# Patient Record
Sex: Female | Born: 1960 | Race: White | Hispanic: No | Marital: Married | Smoking: Never smoker
Health system: Southern US, Community
[De-identification: ages and names within clinical notes are randomized; demographics above are authoritative.]

## PROBLEM LIST (undated history)

## (undated) DIAGNOSIS — E119 Type 2 diabetes mellitus without complications: Secondary | ICD-10-CM

---

## 1992-09-18 HISTORY — PX: CHOLECYSTECTOMY: SHX55

## 1998-05-15 ENCOUNTER — Ambulatory Visit (HOSPITAL_COMMUNITY): Admission: RE | Admit: 1998-05-15 | Discharge: 1998-05-15 | Payer: Self-pay | Admitting: Family Medicine

## 2003-04-24 ENCOUNTER — Encounter: Payer: Self-pay | Admitting: Family Medicine

## 2003-04-24 ENCOUNTER — Ambulatory Visit (HOSPITAL_COMMUNITY): Admission: RE | Admit: 2003-04-24 | Discharge: 2003-04-24 | Payer: Self-pay | Admitting: Family Medicine

## 2004-05-12 ENCOUNTER — Ambulatory Visit (HOSPITAL_BASED_OUTPATIENT_CLINIC_OR_DEPARTMENT_OTHER): Admission: RE | Admit: 2004-05-12 | Discharge: 2004-05-12 | Payer: Self-pay | Admitting: Oral and Maxillofacial Surgery

## 2005-09-18 HISTORY — PX: TOTAL THYROIDECTOMY: SHX2547

## 2005-12-19 ENCOUNTER — Encounter: Admission: RE | Admit: 2005-12-19 | Discharge: 2005-12-19 | Payer: Self-pay | Admitting: General Surgery

## 2006-01-01 ENCOUNTER — Encounter: Admission: RE | Admit: 2006-01-01 | Discharge: 2006-01-01 | Payer: Self-pay | Admitting: General Surgery

## 2006-01-01 ENCOUNTER — Other Ambulatory Visit: Admission: RE | Admit: 2006-01-01 | Discharge: 2006-01-01 | Payer: Self-pay | Admitting: Interventional Radiology

## 2006-01-01 ENCOUNTER — Encounter (INDEPENDENT_AMBULATORY_CARE_PROVIDER_SITE_OTHER): Payer: Self-pay | Admitting: *Deleted

## 2006-02-13 ENCOUNTER — Ambulatory Visit (HOSPITAL_COMMUNITY): Admission: RE | Admit: 2006-02-13 | Discharge: 2006-02-14 | Payer: Self-pay | Admitting: General Surgery

## 2006-02-13 ENCOUNTER — Encounter (INDEPENDENT_AMBULATORY_CARE_PROVIDER_SITE_OTHER): Payer: Self-pay | Admitting: *Deleted

## 2006-03-30 ENCOUNTER — Encounter: Admission: RE | Admit: 2006-03-30 | Discharge: 2006-03-30 | Payer: Self-pay | Admitting: Endocrinology

## 2006-04-06 ENCOUNTER — Encounter: Admission: RE | Admit: 2006-04-06 | Discharge: 2006-04-06 | Payer: Self-pay | Admitting: Endocrinology

## 2006-04-13 ENCOUNTER — Encounter: Admission: RE | Admit: 2006-04-13 | Discharge: 2006-04-13 | Payer: Self-pay | Admitting: Endocrinology

## 2007-05-15 ENCOUNTER — Encounter: Admission: RE | Admit: 2007-05-15 | Discharge: 2007-05-15 | Payer: Self-pay | Admitting: Endocrinology

## 2007-05-29 IMAGING — CR DG CHEST 2V
2 series · 2 of 2 positions shown · non-contrast
Comparison: None

CLINICAL DATA: Multinodular goiter, preop

CHEST - 2 VIEW:

[view not recorded (1 of 2)]
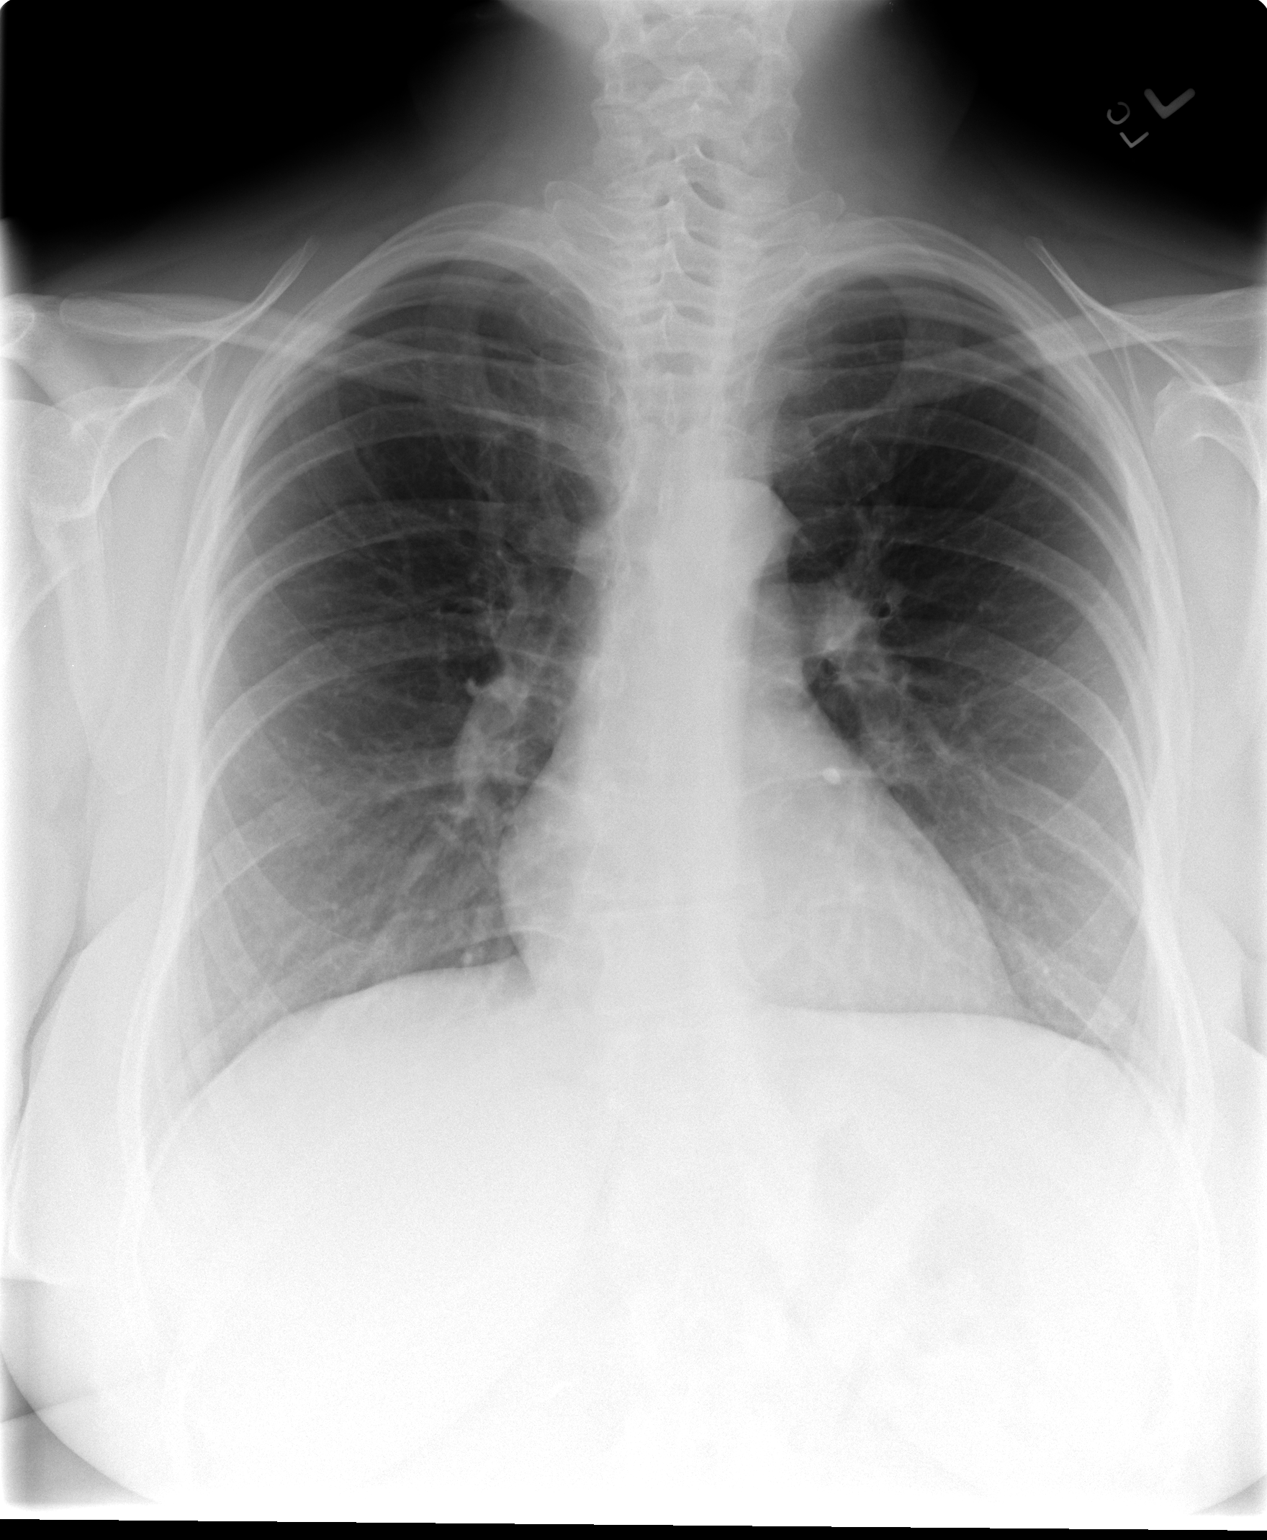

[view not recorded (2 of 2)]
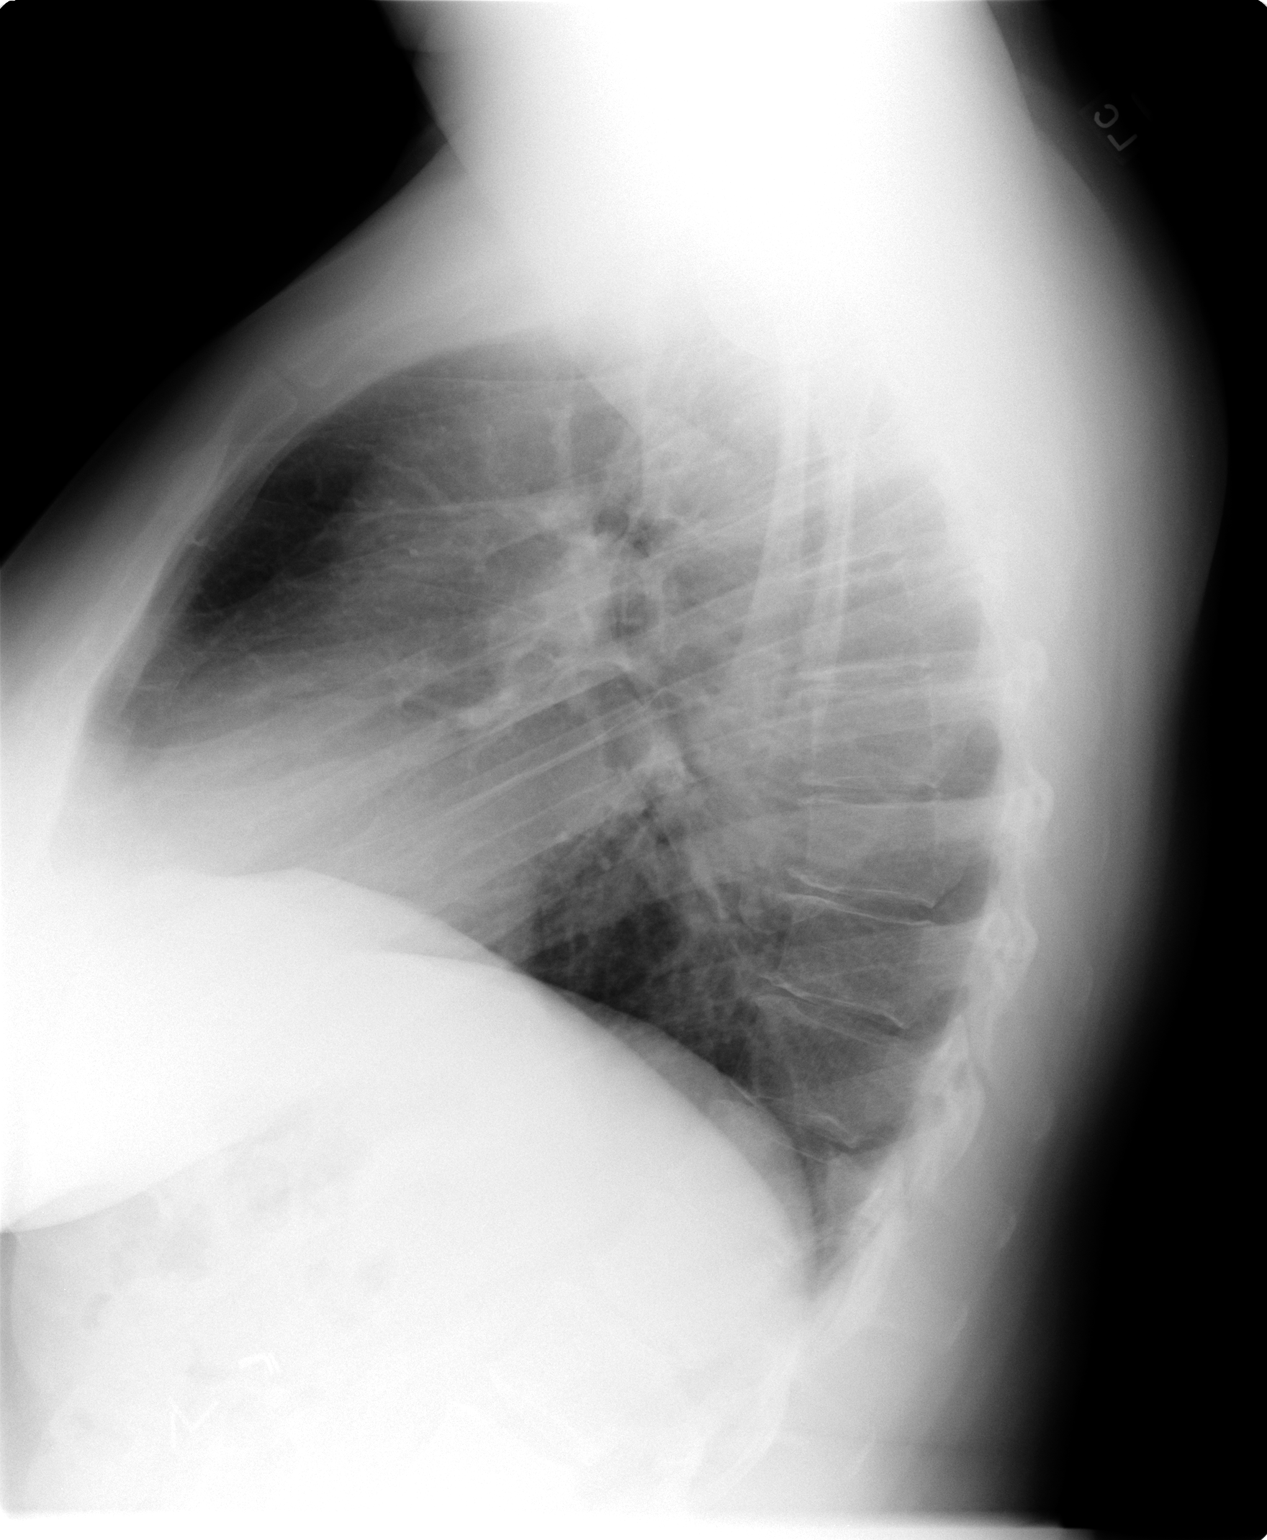

[2 of 2 positions shown; findings below may reference images not displayed]

FINDINGS: The heart size and mediastinal contours are within normal limits. 
Both lungs are clear.  The visualized skeletal structures are unremarkable.
IMPRESSION: No active cardiopulmonary disease

## 2007-07-20 IMAGING — NM NM RAI THERAPY CANCER THYROID
1 series · 1 of 1 positions shown · non-contrast
Comparison: none

CLINICAL DATA: 45-year-old, thyroid cancer. 
 Q-BRB THERAPY FOR THYROID CANCER:
 Written informed consent was obtained from the patient and her husband.   The patient received a total of 105.7 mCi Q-BRB orally.  Appropriate precautions and instructions were reviewed with the patient.  Patient will return for a 10 day posttherapy exam.

[st statics, dual detec · 1 of 1 slices shown]
[im 1/1]
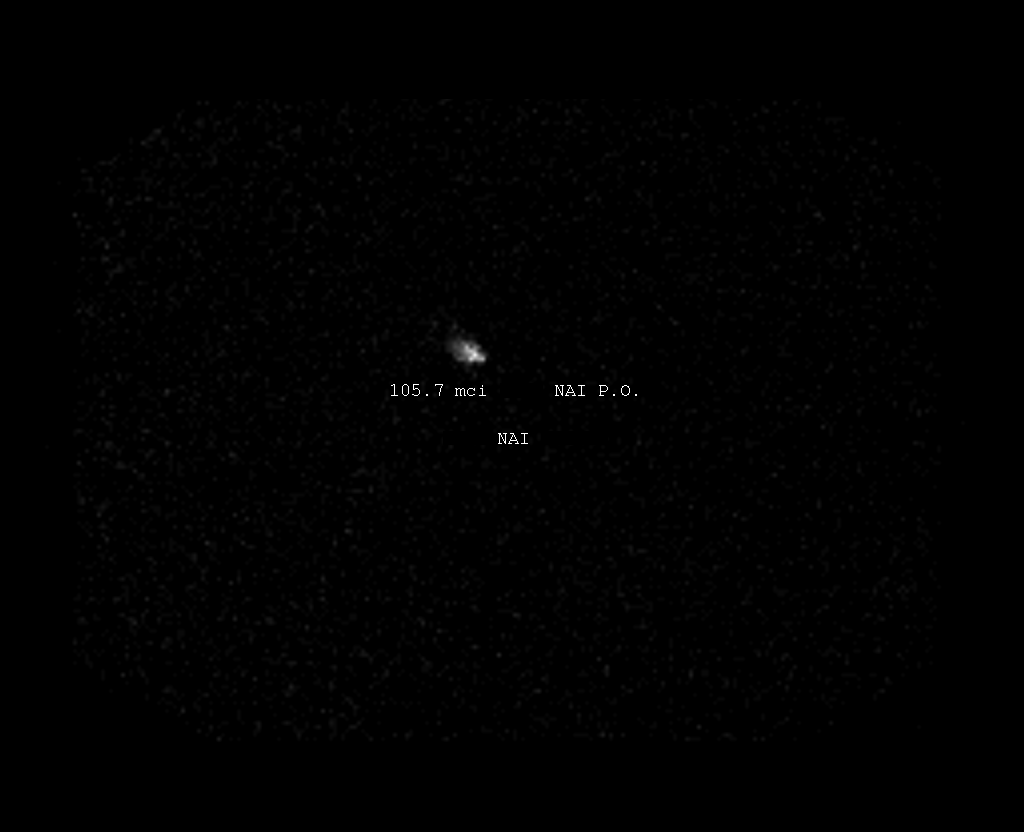

[1 of 1 positions shown; findings below may reference images not displayed]

IMPRESSION: Treatment of thyroid cancer with 105.7 mCi Q-BRB.

## 2008-05-13 ENCOUNTER — Encounter: Admission: RE | Admit: 2008-05-13 | Discharge: 2008-05-13 | Payer: Self-pay | Admitting: Internal Medicine

## 2008-06-08 ENCOUNTER — Encounter (HOSPITAL_COMMUNITY): Admission: RE | Admit: 2008-06-08 | Discharge: 2008-09-06 | Payer: Self-pay | Admitting: Internal Medicine

## 2009-05-12 ENCOUNTER — Encounter: Admission: RE | Admit: 2009-05-12 | Discharge: 2009-05-12 | Payer: Self-pay | Admitting: Internal Medicine

## 2010-06-20 ENCOUNTER — Encounter: Admission: RE | Admit: 2010-06-20 | Discharge: 2010-06-20 | Payer: Self-pay | Admitting: Internal Medicine

## 2010-10-09 ENCOUNTER — Encounter: Payer: Self-pay | Admitting: Endocrinology

## 2011-02-03 NOTE — Op Note (Signed)
NAME:  Alice, Meyer NO.:  [id number]   MEDICAL RECORD NO.:  [id number]          PATIENT TYPE:  OIB   LOCATION:  1614                         FACILITY:  [facility]   PHYSICIAN:  Anselm Pancoast. 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   OPERATIONS:  Total thyroidectomy general anesthesia.   SURGEON:  Anselm Pancoast. Zachery Dakins, M.D.   ASSISTANT:  Adolph Pollack, M.D.   HISTORY:  Alice Meyer is a 50 year old female whom I first saw approximately  3 years ago when she was referred for a goiter and tenderness and hoarseness  in her neck.  She had an ultrasound that obviously showed a multinodular  goiter.  She was definitely hoarse, and was having minimal problems with  swallowing; and I referred her to [facility] and they did a very careful  indirect laryngoscopy in the center and noted that this was probably related  to aspiration; and treated her for the laryngitis.  I also placed her on  thyroid suppression and she became much less symptomatic.  The gland  decreased in size, originally she was on 175 mcg, she weighs about 235 and  we decreased it to 125 after probably about 6 months.  With this, she  appeared to be doing fine, no hoarseness or tenderness within her neck; and  I saw her back and it appeared that the gland was beginning to increase in  size slightly, and her TSS was not suppressed.  The TSH was 483 where it had  been down to the .027 range and we repeated her ultrasound, also in this  interim.  Her mother had had a thyroid cancer and we did a biopsy, increased  her Synthroid to 150 grams with the gland decreasing in size, again, and the  aspiration cytology did show some occasional Hurthle cell, and malignancy  could not be definitely ruled out.  She and I decided to go ahead  and plan  on doing a total thyroidectomy; and she is electively scheduled for this.  She has not had any hoarseness recently, does occasionally get a little bit  of pressure-type symptoms with swallowing.   MEDICATIONS:  She is on medications of Valtrex and her thyroid replacement.  She is also on a multivitamin and baby aspirin which she has held for the  last week.   DESCRIPTION OF PROCEDURE:  The patient was taken to the operative suite,  positioned on the OR table.  Induction of general anesthesia and  endotracheal tube placed.  A roll was placed up under her scapula and she  was draped sterilely.  Prepped with Betadine and surgical solution, and  placed the bed in a very slight reverse Trendelenburg position.  I marked  the area for the thyroid incision about two fingerbreadths at mid and about  a finger laterally, and then about a 5-cm incision was made.  Sharp  dissection through the skin, subcutaneous  tissue and platysma.   We elevated the platysma superiorly and inferiorly and then  __________  retractor was placed.  I then opened the strap muscles, and you could see  this finger which is the area that was biopsied was kind of coming off the  isthmus and to the left; and then the other left and right lobes had the  strap muscles adherent to this; and it definitely teased the structures.  The inferior lobe on the right was first elevated and then the vessels were  either tied with 3-0 silk, baby hemoclips between the gland, and elevated  this and this allowed [place] to get a little bit of mobility.  Then we switched  to the superior right; and I could encompass the superior vessels coming to  the superior lobe, and then I doubly tied with 2-0 silk and put a regular  clip, and then divided them.   Then I could rotate the gland into the field.  I still had not been able to  identify the recurrent laryngeal nerve and we, kind of very carefully teased  the gland medially.  There was a  little finger of thyroid tissue going up to  right where, I am sure, the recurrent laryngeal nerve was; and I elected to  switch to the left side, at this point, to get a little bit mobility.  The  left side was about the same size.  This finger that they had actually  biopsied was coming really off the isthmus to the left; and then the  inferior vessels were similarly manage; and divided them really right on the  gland; and then the little superior lobe encompassed the vessels; and these  were tied.  There was one tied with 3-0 and one tied with 2-0 silk and  clipped.   Next the gland could be rotated into the field.  A parathyroid gland,  superior, was identified and likewise on the left side that was this little  finger of thyroid tissue.  We identified the recurrent laryngeal nerve where  it kind of goes into the lateral horn of the trachea; and elected, since  this appears to be really very minimally any attached to the gland itself,  left it in place as we freed up the left gland; and did the similar on the  right, but the little finger of thyroid tissue on the right could be teased.  It was immediately adherent up into the superior parathyroid.  The isthmus  was separated from the trachea and larynx; and then the specimen was sent  for examination.  Dr. Carney Living examined the tissue; and he said this was  definitely Hashimoto's thyroiditis; there was one little area that he says  he is not sure, but I elected to leave this little, probably 0.5 gram of  thyroid tissue, on the left making sure that he did not damage the recurrent  laryngeal nerve because of its proximity.   The bleeding had been controlled; a couple of little hemoclips were placed  and 2 little pieces of Surgicel.  The strap muscles were approximated with 4-  0 Vicryl.  Platysma was approximated with 4-0 Vicryl; and then a 4-0 Monocryl and Benzoin and Steri-Strips on the skin.  The patient had  tolerated the procedure  nicely; and on extubation we could see that both  vocal cords moved symmetrically; and she was breathing without difficulty.           ______________________________  Anselm Pancoast.  Zachery Dakins, M.D.     WJW/MEDQ  D:  02/13/2006  T:  02/13/2006  Job:  [id number]

## 2011-02-03 NOTE — Procedures (Signed)
NAME:  Alice Meyer, Alice Meyer               ACCOUNT NO.:  [id number]   MEDICAL RECORD NO.:  [id number]          PATIENT TYPE:  OUT   LOCATION:  SLEEP CENTER                 FACILITY:  [facility]   PHYSICIAN:  Clinton D. Maple Hudson, M.D. DATE OF BIRTH:  01/10/1961   DATE OF ADMISSION:  05/12/2004  DATE OF DISCHARGE:                              NOCTURNAL POLYSOMNOGRAM   REFERRING PHYSICIAN:  Dr. Dutch Quint   INDICATION FOR STUDY:  Hypersomnia with sleep apnea, loud snoring, sleep  paralysis, restless and nonrestorative sleep with vivid dreams.  Medical  treatment for hypothyroidism.   EPWORTH SLEEPINESS SCORE:  3/24   BODY MASS INDEX:  34.7   WEIGHT:  235 pounds   MEDICATION LISTED:  Synthroid.   SLEEP ARCHITECTURE:  Total sleep time 408 minutes with sleep efficiency 92%.  Stage I was 3%, Stage II 82%, Stages III and IV 6%, REM was 10% of total  sleep time.  Latency to sleep onset 5.5 minutes, latency to REM 189 minutes,  awake after sleep onset 32 minutes, arousal index 44.8/hr.  No sleep  medication was reported.   RESPIRATORY DATA:  NPSG protocol.  Mild obstructive sleep apnea/hypopnea  syndrome, RDI 12.6/hr including 45 hypopneas and 26 obstructive apneas, 10  mixed apneas and 5 central apneas.  Events were not positional.  REM RDI was  60/hr.   OXYGEN DATA:  Light to moderate snoring, especially while sleeping supine,  with mild oxygen desaturation to a nadir of 87%.   CARDIAC DATA:  Normal cardiac rhythm with occasional PVC.   MOVEMENT/PARASOMNIA:  Occasional leg jerks without arousal.   IMPRESSION/RECOMMENDATION:  Mild obstructive sleep apnea/hypopnea syndrome,  respiratory disturbance index 12.6/hr, more pronounced in rapid eye movement  (respiratory disturbance index 60/hr).  This was associated with minimal  oxygen desaturation.  Normal cardiac rhythm with premature ventricular  contractions.  Occasional leg jerk.  Review of video indicates some  nonspecific shifting of arms and  legs mostly during arousals with no unusual  behavior otherwise.  She would qualify to return for continuous positive  airway pressure titration if appropriate, otherwise consider alternative  therapies.                                   ______________________________                                Rennis Chris Maple Hudson, M.D.                                Diplomate, American Board of Sleep Medicine    CDY/MEDQ  D:  05/15/2004 12:40:56  T:  05/16/2004 81:19:14  Job:  [id number]

## 2011-06-19 LAB — TSH: TSH: 20.715 — ABNORMAL HIGH

## 2011-06-19 LAB — THYROGLOBULIN LEVEL: Thyroglobulin: <0.1 ng/mL — ABNORMAL LOW (ref 1.3–31.8)

## 2012-07-01 ENCOUNTER — Other Ambulatory Visit: Payer: Self-pay | Admitting: Internal Medicine

## 2012-07-01 DIAGNOSIS — C73 Malignant neoplasm of thyroid gland: Secondary | ICD-10-CM

## 2012-07-09 ENCOUNTER — Ambulatory Visit
Admission: RE | Admit: 2012-07-09 | Discharge: 2012-07-09 | Disposition: A | Discharge status: Home / Self Care | Payer: 59 | Source: Ambulatory Visit | Attending: Internal Medicine | Admitting: Internal Medicine

## 2012-07-09 DIAGNOSIS — C73 Malignant neoplasm of thyroid gland: Secondary | ICD-10-CM

## 2012-07-10 ENCOUNTER — Other Ambulatory Visit: Payer: Self-pay

## 2012-10-15 ENCOUNTER — Other Ambulatory Visit (HOSPITAL_COMMUNITY)
Admission: RE | Admit: 2012-10-15 | Discharge: 2012-10-15 | Disposition: A | Discharge status: Home / Self Care | Payer: 59 | Source: Ambulatory Visit | Attending: Obstetrics and Gynecology | Admitting: Obstetrics and Gynecology

## 2012-10-15 ENCOUNTER — Other Ambulatory Visit: Payer: Self-pay | Admitting: Obstetrics and Gynecology

## 2012-10-15 DIAGNOSIS — Z1151 Encounter for screening for human papillomavirus (HPV): Secondary | ICD-10-CM | POA: Insufficient documentation

## 2012-10-15 DIAGNOSIS — Z01419 Encounter for gynecological examination (general) (routine) without abnormal findings: Secondary | ICD-10-CM | POA: Insufficient documentation

## 2012-10-16 ENCOUNTER — Other Ambulatory Visit: Payer: Self-pay | Admitting: Obstetrics and Gynecology

## 2012-10-16 DIAGNOSIS — Z1231 Encounter for screening mammogram for malignant neoplasm of breast: Secondary | ICD-10-CM

## 2012-11-14 ENCOUNTER — Ambulatory Visit
Admission: RE | Admit: 2012-11-14 | Discharge: 2012-11-14 | Disposition: A | Discharge status: Home / Self Care | Payer: 59 | Source: Ambulatory Visit | Attending: Obstetrics and Gynecology | Admitting: Obstetrics and Gynecology

## 2012-11-14 DIAGNOSIS — Z1231 Encounter for screening mammogram for malignant neoplasm of breast: Secondary | ICD-10-CM

## 2013-10-17 ENCOUNTER — Other Ambulatory Visit (HOSPITAL_COMMUNITY)
Admission: RE | Admit: 2013-10-17 | Discharge: 2013-10-17 | Disposition: A | Discharge status: Home / Self Care | Payer: 59 | Source: Ambulatory Visit | Attending: Obstetrics and Gynecology | Admitting: Obstetrics and Gynecology

## 2013-10-17 ENCOUNTER — Other Ambulatory Visit: Payer: Self-pay | Admitting: Obstetrics and Gynecology

## 2013-10-17 DIAGNOSIS — Z1151 Encounter for screening for human papillomavirus (HPV): Secondary | ICD-10-CM | POA: Insufficient documentation

## 2013-10-17 DIAGNOSIS — Z01419 Encounter for gynecological examination (general) (routine) without abnormal findings: Secondary | ICD-10-CM | POA: Insufficient documentation

## 2014-05-20 ENCOUNTER — Other Ambulatory Visit: Payer: Self-pay | Admitting: Gastroenterology

## 2014-08-17 ENCOUNTER — Encounter (HOSPITAL_COMMUNITY): Payer: Self-pay | Admitting: *Deleted

## 2014-09-01 ENCOUNTER — Encounter (HOSPITAL_COMMUNITY): Payer: Self-pay | Admitting: *Deleted

## 2014-09-14 ENCOUNTER — Other Ambulatory Visit: Payer: Self-pay | Admitting: Gastroenterology

## 2014-09-20 ENCOUNTER — Encounter (HOSPITAL_COMMUNITY): Payer: Self-pay | Admitting: Anesthesiology

## 2014-09-20 NOTE — Anesthesia Preprocedure Evaluation (Signed)
Anesthesia Evaluation  Patient identified by MRN, date of birth, ID band Patient awake    Reviewed: Allergy & Precautions, H&P , NPO status , Patient's Chart, lab work & pertinent test results  Airway Mallampati: II  TM Distance: >3 FB Neck ROM: Full    Dental no notable dental hx.    Pulmonary neg pulmonary ROS,  breath sounds clear to auscultation  Pulmonary exam normal       Cardiovascular Exercise Tolerance: Good negative cardio ROS  Rhythm:Regular Rate:Normal     Neuro/Psych negative neurological ROS  negative psych ROS   GI/Hepatic negative GI ROS, Neg liver ROS,   Endo/Other  diabetes, Type 2, Oral Hypoglycemic AgentsHypothyroidism   Renal/GU negative Renal ROS  negative genitourinary   Musculoskeletal negative musculoskeletal ROS (+)   Abdominal   Peds negative pediatric ROS (+)  Hematology negative hematology ROS (+)   Anesthesia Other Findings   Reproductive/Obstetrics negative OB ROS                             Anesthesia Physical Anesthesia Plan  ASA: II  Anesthesia Plan: MAC   Post-op Pain Management:    Induction: Intravenous  Airway Management Planned:   Additional Equipment:   Intra-op Plan:   Post-operative Plan:   Informed Consent: I have reviewed the patients History and Physical, chart, labs and discussed the procedure including the risks, benefits and alternatives for the proposed anesthesia with the patient or authorized representative who has indicated his/her understanding and acceptance.   Dental advisory given  Plan Discussed with: CRNA  Anesthesia Plan Comments:         Anesthesia Quick Evaluation

## 2014-09-21 ENCOUNTER — Encounter (HOSPITAL_COMMUNITY): Payer: Self-pay | Admitting: Certified Registered Nurse Anesthetist

## 2014-09-21 ENCOUNTER — Ambulatory Visit (HOSPITAL_COMMUNITY)
Admission: RE | Admit: 2014-09-21 | Discharge: 2014-09-21 | Disposition: A | Discharge status: Home / Self Care | Payer: 59 | Source: Ambulatory Visit | Attending: Gastroenterology | Admitting: Gastroenterology

## 2014-09-21 ENCOUNTER — Encounter (HOSPITAL_COMMUNITY)
Admission: RE | Disposition: A | Discharge status: Home / Self Care | Payer: Self-pay | Source: Ambulatory Visit | Attending: Gastroenterology

## 2014-09-21 ENCOUNTER — Ambulatory Visit (HOSPITAL_COMMUNITY): Payer: 59 | Admitting: Anesthesiology

## 2014-09-21 DIAGNOSIS — Z85850 Personal history of malignant neoplasm of thyroid: Secondary | ICD-10-CM | POA: Insufficient documentation

## 2014-09-21 DIAGNOSIS — D125 Benign neoplasm of sigmoid colon: Secondary | ICD-10-CM | POA: Insufficient documentation

## 2014-09-21 DIAGNOSIS — E119 Type 2 diabetes mellitus without complications: Secondary | ICD-10-CM | POA: Diagnosis not present

## 2014-09-21 DIAGNOSIS — Z1211 Encounter for screening for malignant neoplasm of colon: Secondary | ICD-10-CM | POA: Diagnosis not present

## 2014-09-21 DIAGNOSIS — Z9889 Other specified postprocedural states: Secondary | ICD-10-CM | POA: Diagnosis not present

## 2014-09-21 DIAGNOSIS — E039 Hypothyroidism, unspecified: Secondary | ICD-10-CM | POA: Insufficient documentation

## 2014-09-21 DIAGNOSIS — K5730 Diverticulosis of large intestine without perforation or abscess without bleeding: Secondary | ICD-10-CM | POA: Diagnosis not present

## 2014-09-21 HISTORY — DX: Type 2 diabetes mellitus without complications: E11.9

## 2014-09-21 HISTORY — PX: COLONOSCOPY WITH PROPOFOL: SHX5780

## 2014-09-21 SURGERY — COLONOSCOPY WITH PROPOFOL
Anesthesia: Monitor Anesthesia Care

## 2014-09-21 MED ORDER — PROPOFOL 10 MG/ML IV BOLUS
INTRAVENOUS | Status: DC | PRN
  Administered 2014-09-21: 80 mg via INTRAVENOUS

## 2014-09-21 MED ORDER — PROPOFOL INFUSION 10 MG/ML OPTIME
INTRAVENOUS | Status: DC | PRN
  Administered 2014-09-21: 140 ug/kg/min via INTRAVENOUS

## 2014-09-21 MED ORDER — SODIUM CHLORIDE 0.9 % IV SOLN: INTRAVENOUS | Status: DC

## 2014-09-21 MED ORDER — LACTATED RINGERS IV SOLN
INTRAVENOUS | Status: DC
  Administered 2014-09-21: 09:00:00 via INTRAVENOUS
  Administered 2014-09-21: 1000 mL via INTRAVENOUS

## 2014-09-21 SURGICAL SUPPLY — 21 items

## 2014-09-21 NOTE — Op Note (Signed)
Procedure: Baseline screening colonoscopy  Endoscopist: Earle Gell  Premedication: Propofol administered by anesthesia  Procedure: The patient was placed in the left lateral decubitus position. Anal inspection and digital rectal exam were normal. The Pentax pediatric colonoscope was introduced into the rectum and advanced to the cecum. A normal-appearing ileocecal valve was identified. A normal-appearing appendiceal orifice was identified. Colonic preparation for the exam today was good. Withdrawal time was 16 minutes  Rectum. Normal. Retroflexed view of the distal rectum normal  Sigmoid colon. Colonic diverticulosis. From the mid sigmoid colon a 3 mm sessile polyp was removed with the cold biopsy forceps  Descending colon. Normal  Splenic flexure. Normal  Transverse colon. Normal  Hepatic flexure. Normal  Ascending colon. Normal  Cecum and ileocecal valve. Normal  Assessment:  #1. From the mid sigmoid colon, a 3 mm sessile polyp was removed with cold biopsy forceps  #2. Sigmoid colon diverticulosis  #3. Otherwise normal colonoscopy  Recommendation: If the sigmoid colon polyp returns adenomatous pathologically, the patient should undergo a surveillance colonoscopy in 5 years. If the polyp returns nonneoplastic, she should undergo a repeat screening colonoscopy in 10 years

## 2014-09-21 NOTE — H&P (Signed)
  Procedure: Baseline screening colonoscopy  History: The patient is a 54 year old female born 05/07/1961. She denies a family history of colon cancer. She is scheduled to undergo her first screening colonoscopy with polypectomy to prevent colon cancer.  Past medical history: Papillary thyroid cancer. Total thyroidectomy. Post thyroidectomy hypothyroidism. Cholecystectomy.  Exam: The patient is alert and lying comfortably on the endoscopy stretcher. Abdomen is soft and nontender to palpation. Lungs are clear to auscultation. Cardiac exam reveals a regular rhythm.  Plan: Proceed with baseline screening colonoscopy

## 2014-09-21 NOTE — Discharge Instructions (Signed)
Colonoscopy, Care After °These instructions give you information on caring for yourself after your procedure. Your doctor may also give you more specific instructions. Call your doctor if you have any problems or questions after your procedure. °HOME CARE °· Do not drive for 24 hours. °· Do not sign important papers or use machinery for 24 hours. °· You may shower. °· You may go back to your usual activities, but go slower for the first 24 hours. °· Take rest breaks often during the first 24 hours. °· Walk around or use warm packs on your belly (abdomen) if you have belly cramping or gas. °· Drink enough fluids to keep your pee (urine) clear or pale yellow. °· Resume your normal diet. Avoid heavy or fried foods. °· Avoid drinking alcohol for 24 hours or as told by your doctor. °· Only take medicines as told by your doctor. °If a tissue sample (biopsy) was taken during the procedure:  °· Do not take aspirin or blood thinners for 7 days, or as told by your doctor. °· Do not drink alcohol for 7 days, or as told by your doctor. °· Eat soft foods for the first 24 hours. °GET HELP IF: °You still have a small amount of blood in your poop (stool) 2-3 days after the procedure. °GET HELP RIGHT AWAY IF: °· You have more than a small amount of blood in your poop. °· You see clumps of tissue (blood clots) in your poop. °· Your belly is puffy (swollen). °· You feel sick to your stomach (nauseous) or throw up (vomit). °· You have a fever. °· You have belly pain that gets worse and medicine does not help. °MAKE SURE YOU: °· Understand these instructions. °· Will watch your condition. °· Will get help right away if you are not doing well or get worse. °Document Released: 10/07/2010 Document Revised: 09/09/2013 Document Reviewed: 05/12/2013 °Exit[employer]® Patient Information ©2015 [employer]. This information is not intended to replace advice given to you by your health care provider. Make sure you discuss any questions you have with  your health care provider. ° °

## 2014-09-21 NOTE — Transfer of Care (Signed)
Immediate Anesthesia Transfer of Care Note  Patient: Alice Meyer  Procedure(s) Performed: Procedure(s): COLONOSCOPY WITH PROPOFOL (N/A)  Patient Location: PACU and Endoscopy Unit  Anesthesia Type:MAC  Level of Consciousness: awake, alert  and oriented  Airway & Oxygen Therapy: Patient Spontanous Breathing and Patient connected to face mask oxygen  Post-op Assessment: Report given to PACU RN and Post -op Vital signs reviewed and stable  Post vital signs: Reviewed and stable  Complications: No apparent anesthesia complications

## 2014-09-21 NOTE — Anesthesia Postprocedure Evaluation (Signed)
  Anesthesia Post-op Note  Patient: Alice Meyer  Procedure(s) Performed: Procedure(s) (LRB): COLONOSCOPY WITH PROPOFOL (N/A)  Patient Location: PACU  Anesthesia Type: MAC  Level of Consciousness: awake and alert   Airway and Oxygen Therapy: Patient Spontanous Breathing  Post-op Pain: mild  Post-op Assessment: Post-op Vital signs reviewed, Patient's Cardiovascular Status Stable, Respiratory Function Stable, Patent Airway and No signs of Nausea or vomiting  Last Vitals:  Filed Vitals:   09/21/14 1010  BP: 120/70  Pulse:   Temp:   Resp:     Post-op Vital Signs: stable   Complications: No apparent anesthesia complications

## 2014-09-22 ENCOUNTER — Encounter (HOSPITAL_COMMUNITY): Payer: Self-pay | Admitting: Gastroenterology

## 2014-09-22 LAB — GLUCOSE, CAPILLARY: GLUCOSE-CAPILLARY: 99 mg/dL (ref 70–99)

## 2014-10-21 ENCOUNTER — Other Ambulatory Visit: Payer: Self-pay | Admitting: Obstetrics and Gynecology

## 2014-10-23 LAB — CYTOLOGY - PAP

## 2015-10-04 ENCOUNTER — Other Ambulatory Visit: Payer: Self-pay

## 2015-10-04 DIAGNOSIS — Z1231 Encounter for screening mammogram for malignant neoplasm of breast: Secondary | ICD-10-CM

## 2015-10-19 ENCOUNTER — Ambulatory Visit
Admission: RE | Admit: 2015-10-19 | Discharge: 2015-10-19 | Disposition: A | Discharge status: Home / Self Care | Payer: BLUE CROSS/BLUE SHIELD | Source: Ambulatory Visit

## 2015-10-19 DIAGNOSIS — Z1231 Encounter for screening mammogram for malignant neoplasm of breast: Secondary | ICD-10-CM

## 2015-10-26 ENCOUNTER — Other Ambulatory Visit: Payer: Self-pay | Admitting: Obstetrics and Gynecology

## 2015-10-26 ENCOUNTER — Other Ambulatory Visit (HOSPITAL_COMMUNITY)
Admission: RE | Admit: 2015-10-26 | Discharge: 2015-10-26 | Disposition: A | Discharge status: Home / Self Care | Payer: BLUE CROSS/BLUE SHIELD | Source: Ambulatory Visit | Attending: Obstetrics and Gynecology | Admitting: Obstetrics and Gynecology

## 2015-10-26 DIAGNOSIS — Z1151 Encounter for screening for human papillomavirus (HPV): Secondary | ICD-10-CM | POA: Diagnosis present

## 2015-10-26 DIAGNOSIS — Z01419 Encounter for gynecological examination (general) (routine) without abnormal findings: Secondary | ICD-10-CM | POA: Diagnosis present

## 2015-10-28 LAB — CYTOLOGY - PAP

## 2016-01-04 DIAGNOSIS — Z85850 Personal history of malignant neoplasm of thyroid: Secondary | ICD-10-CM | POA: Diagnosis not present

## 2016-01-04 DIAGNOSIS — E119 Type 2 diabetes mellitus without complications: Secondary | ICD-10-CM | POA: Diagnosis not present

## 2016-01-04 DIAGNOSIS — E890 Postprocedural hypothyroidism: Secondary | ICD-10-CM | POA: Diagnosis not present

## 2016-04-11 DIAGNOSIS — E119 Type 2 diabetes mellitus without complications: Secondary | ICD-10-CM | POA: Diagnosis not present

## 2016-04-11 DIAGNOSIS — Z1159 Encounter for screening for other viral diseases: Secondary | ICD-10-CM | POA: Diagnosis not present

## 2016-04-11 DIAGNOSIS — E039 Hypothyroidism, unspecified: Secondary | ICD-10-CM | POA: Diagnosis not present

## 2016-04-11 DIAGNOSIS — Z0000 Encounter for general adult medical examination without abnormal findings: Secondary | ICD-10-CM | POA: Diagnosis not present

## 2016-07-14 DIAGNOSIS — E119 Type 2 diabetes mellitus without complications: Secondary | ICD-10-CM | POA: Diagnosis not present

## 2016-07-14 DIAGNOSIS — Z79899 Other long term (current) drug therapy: Secondary | ICD-10-CM | POA: Diagnosis not present

## 2016-07-14 DIAGNOSIS — Z85850 Personal history of malignant neoplasm of thyroid: Secondary | ICD-10-CM | POA: Diagnosis not present

## 2016-07-14 DIAGNOSIS — E890 Postprocedural hypothyroidism: Secondary | ICD-10-CM | POA: Diagnosis not present

## 2016-11-06 DIAGNOSIS — Z01419 Encounter for gynecological examination (general) (routine) without abnormal findings: Secondary | ICD-10-CM | POA: Diagnosis not present

## 2016-11-06 DIAGNOSIS — N951 Menopausal and female climacteric states: Secondary | ICD-10-CM | POA: Diagnosis not present

## 2017-01-19 DIAGNOSIS — E890 Postprocedural hypothyroidism: Secondary | ICD-10-CM | POA: Diagnosis not present

## 2017-01-19 DIAGNOSIS — Z79899 Other long term (current) drug therapy: Secondary | ICD-10-CM | POA: Diagnosis not present

## 2017-01-19 DIAGNOSIS — E119 Type 2 diabetes mellitus without complications: Secondary | ICD-10-CM | POA: Diagnosis not present

## 2017-01-19 DIAGNOSIS — Z5181 Encounter for therapeutic drug level monitoring: Secondary | ICD-10-CM | POA: Diagnosis not present

## 2017-01-19 DIAGNOSIS — Z85850 Personal history of malignant neoplasm of thyroid: Secondary | ICD-10-CM | POA: Diagnosis not present

## 2017-03-19 DIAGNOSIS — E890 Postprocedural hypothyroidism: Secondary | ICD-10-CM | POA: Diagnosis not present

## 2017-05-04 DIAGNOSIS — E890 Postprocedural hypothyroidism: Secondary | ICD-10-CM | POA: Diagnosis not present

## 2017-07-26 DIAGNOSIS — E890 Postprocedural hypothyroidism: Secondary | ICD-10-CM | POA: Diagnosis not present

## 2017-07-26 DIAGNOSIS — Z5181 Encounter for therapeutic drug level monitoring: Secondary | ICD-10-CM | POA: Diagnosis not present

## 2017-07-26 DIAGNOSIS — E119 Type 2 diabetes mellitus without complications: Secondary | ICD-10-CM | POA: Diagnosis not present

## 2017-07-26 DIAGNOSIS — Z85850 Personal history of malignant neoplasm of thyroid: Secondary | ICD-10-CM | POA: Diagnosis not present

## 2017-08-16 DIAGNOSIS — Z1231 Encounter for screening mammogram for malignant neoplasm of breast: Secondary | ICD-10-CM | POA: Diagnosis not present

## 2017-11-08 DIAGNOSIS — Z01419 Encounter for gynecological examination (general) (routine) without abnormal findings: Secondary | ICD-10-CM | POA: Diagnosis not present

## 2017-11-19 DIAGNOSIS — E890 Postprocedural hypothyroidism: Secondary | ICD-10-CM | POA: Diagnosis not present

## 2017-11-19 DIAGNOSIS — Z5181 Encounter for therapeutic drug level monitoring: Secondary | ICD-10-CM | POA: Diagnosis not present

## 2017-11-19 DIAGNOSIS — Z85850 Personal history of malignant neoplasm of thyroid: Secondary | ICD-10-CM | POA: Diagnosis not present

## 2017-11-19 DIAGNOSIS — E119 Type 2 diabetes mellitus without complications: Secondary | ICD-10-CM | POA: Diagnosis not present

## 2017-11-19 DIAGNOSIS — Z79899 Other long term (current) drug therapy: Secondary | ICD-10-CM | POA: Diagnosis not present

## 2018-11-11 ENCOUNTER — Other Ambulatory Visit (HOSPITAL_COMMUNITY)
Admission: RE | Admit: 2018-11-11 | Discharge: 2018-11-11 | Disposition: A | Discharge status: Home / Self Care | Payer: BLUE CROSS/BLUE SHIELD | Source: Ambulatory Visit | Attending: Obstetrics and Gynecology | Admitting: Obstetrics and Gynecology

## 2018-11-11 ENCOUNTER — Other Ambulatory Visit: Payer: Self-pay | Admitting: Obstetrics and Gynecology

## 2018-11-11 DIAGNOSIS — Z01419 Encounter for gynecological examination (general) (routine) without abnormal findings: Secondary | ICD-10-CM | POA: Insufficient documentation

## 2018-11-12 LAB — CYTOLOGY - PAP
Diagnosis: NEGATIVE
HPV: NOT DETECTED

## 2018-11-26 DIAGNOSIS — Z85850 Personal history of malignant neoplasm of thyroid: Secondary | ICD-10-CM | POA: Diagnosis not present

## 2018-11-26 DIAGNOSIS — Z79899 Other long term (current) drug therapy: Secondary | ICD-10-CM | POA: Diagnosis not present

## 2018-11-26 DIAGNOSIS — E890 Postprocedural hypothyroidism: Secondary | ICD-10-CM | POA: Diagnosis not present

## 2018-11-26 DIAGNOSIS — E119 Type 2 diabetes mellitus without complications: Secondary | ICD-10-CM | POA: Diagnosis not present

## 2018-12-05 DIAGNOSIS — E119 Type 2 diabetes mellitus without complications: Secondary | ICD-10-CM | POA: Diagnosis not present

## 2018-12-05 DIAGNOSIS — E890 Postprocedural hypothyroidism: Secondary | ICD-10-CM | POA: Diagnosis not present

## 2018-12-05 DIAGNOSIS — Z85850 Personal history of malignant neoplasm of thyroid: Secondary | ICD-10-CM | POA: Diagnosis not present

## 2018-12-05 DIAGNOSIS — Z5181 Encounter for therapeutic drug level monitoring: Secondary | ICD-10-CM | POA: Diagnosis not present

## 2019-01-10 DIAGNOSIS — E119 Type 2 diabetes mellitus without complications: Secondary | ICD-10-CM | POA: Diagnosis not present

## 2019-08-21 DIAGNOSIS — Z5181 Encounter for therapeutic drug level monitoring: Secondary | ICD-10-CM | POA: Diagnosis not present

## 2019-08-21 DIAGNOSIS — E890 Postprocedural hypothyroidism: Secondary | ICD-10-CM | POA: Diagnosis not present

## 2019-08-21 DIAGNOSIS — E119 Type 2 diabetes mellitus without complications: Secondary | ICD-10-CM | POA: Diagnosis not present

## 2019-08-21 DIAGNOSIS — Z85850 Personal history of malignant neoplasm of thyroid: Secondary | ICD-10-CM | POA: Diagnosis not present

## 2019-10-10 DIAGNOSIS — Z1231 Encounter for screening mammogram for malignant neoplasm of breast: Secondary | ICD-10-CM | POA: Diagnosis not present

## 2019-12-12 DIAGNOSIS — Z01419 Encounter for gynecological examination (general) (routine) without abnormal findings: Secondary | ICD-10-CM | POA: Diagnosis not present

## 2020-02-11 ENCOUNTER — Ambulatory Visit: Payer: BC Managed Care – PPO | Attending: Internal Medicine

## 2020-02-11 DIAGNOSIS — Z23 Encounter for immunization: Secondary | ICD-10-CM

## 2020-02-11 NOTE — Progress Notes (Signed)
   Covid-19 Vaccination Clinic  Name:  Alice Meyer    MRN: [id number] DOB: 07-22-1961  02/11/2020  Ms. Keetch was observed post Covid-19 immunization for 15 minutes without incident. She was provided with Vaccine Information Sheet and instruction to access the V-Safe system.   Ms. Mansoor was instructed to call 911 with any severe reactions post vaccine: Marland Kitchen Difficulty breathing  . Swelling of face and throat  . A fast heartbeat  . A bad rash all over body  . Dizziness and weakness   Immunizations Administered    Name Date Dose VIS Date Route   JANSSEN COVID-19 VACCINE 02/11/2020  3:38 PM 0.5 mL 11/15/2019 Intramuscular   Manufacturer: Alphonsa Overall   Lot: [id number]   [place]: [id number]

## 2020-02-18 DIAGNOSIS — E890 Postprocedural hypothyroidism: Secondary | ICD-10-CM | POA: Diagnosis not present

## 2020-02-18 DIAGNOSIS — E119 Type 2 diabetes mellitus without complications: Secondary | ICD-10-CM | POA: Diagnosis not present

## 2020-02-18 DIAGNOSIS — Z79899 Other long term (current) drug therapy: Secondary | ICD-10-CM | POA: Diagnosis not present

## 2020-02-18 DIAGNOSIS — Z85850 Personal history of malignant neoplasm of thyroid: Secondary | ICD-10-CM | POA: Diagnosis not present

## 2020-02-18 DIAGNOSIS — Z7984 Long term (current) use of oral hypoglycemic drugs: Secondary | ICD-10-CM | POA: Diagnosis not present

## 2020-04-06 DIAGNOSIS — E890 Postprocedural hypothyroidism: Secondary | ICD-10-CM | POA: Diagnosis not present

## 2020-08-20 DIAGNOSIS — E119 Type 2 diabetes mellitus without complications: Secondary | ICD-10-CM | POA: Diagnosis not present

## 2020-08-20 DIAGNOSIS — Z85850 Personal history of malignant neoplasm of thyroid: Secondary | ICD-10-CM | POA: Diagnosis not present

## 2020-08-20 DIAGNOSIS — E890 Postprocedural hypothyroidism: Secondary | ICD-10-CM | POA: Diagnosis not present

## 2020-08-20 DIAGNOSIS — Z7984 Long term (current) use of oral hypoglycemic drugs: Secondary | ICD-10-CM | POA: Diagnosis not present

## 2020-12-13 DIAGNOSIS — Z01419 Encounter for gynecological examination (general) (routine) without abnormal findings: Secondary | ICD-10-CM | POA: Diagnosis not present

## 2021-02-18 DIAGNOSIS — E119 Type 2 diabetes mellitus without complications: Secondary | ICD-10-CM | POA: Diagnosis not present

## 2021-02-18 DIAGNOSIS — E890 Postprocedural hypothyroidism: Secondary | ICD-10-CM | POA: Diagnosis not present

## 2021-02-18 DIAGNOSIS — Z85850 Personal history of malignant neoplasm of thyroid: Secondary | ICD-10-CM | POA: Diagnosis not present

## 2021-05-27 DIAGNOSIS — E890 Postprocedural hypothyroidism: Secondary | ICD-10-CM | POA: Diagnosis not present

## 2021-05-27 DIAGNOSIS — E119 Type 2 diabetes mellitus without complications: Secondary | ICD-10-CM | POA: Diagnosis not present

## 2021-05-27 DIAGNOSIS — Z85850 Personal history of malignant neoplasm of thyroid: Secondary | ICD-10-CM | POA: Diagnosis not present

## 2021-08-22 DIAGNOSIS — E890 Postprocedural hypothyroidism: Secondary | ICD-10-CM | POA: Diagnosis not present

## 2021-08-22 DIAGNOSIS — Z85850 Personal history of malignant neoplasm of thyroid: Secondary | ICD-10-CM | POA: Diagnosis not present

## 2021-08-22 DIAGNOSIS — E119 Type 2 diabetes mellitus without complications: Secondary | ICD-10-CM | POA: Diagnosis not present

## 2021-08-30 DIAGNOSIS — Z1231 Encounter for screening mammogram for malignant neoplasm of breast: Secondary | ICD-10-CM | POA: Diagnosis not present

## 2021-10-27 DIAGNOSIS — R040 Epistaxis: Secondary | ICD-10-CM | POA: Diagnosis not present

## 2021-11-15 DIAGNOSIS — R040 Epistaxis: Secondary | ICD-10-CM | POA: Diagnosis not present

## 2021-11-18 DIAGNOSIS — E119 Type 2 diabetes mellitus without complications: Secondary | ICD-10-CM | POA: Diagnosis not present

## 2021-11-18 DIAGNOSIS — E890 Postprocedural hypothyroidism: Secondary | ICD-10-CM | POA: Diagnosis not present

## 2021-11-18 DIAGNOSIS — Z85850 Personal history of malignant neoplasm of thyroid: Secondary | ICD-10-CM | POA: Diagnosis not present

## 2022-03-10 DIAGNOSIS — L237 Allergic contact dermatitis due to plants, except food: Secondary | ICD-10-CM | POA: Diagnosis not present

## 2022-04-06 DIAGNOSIS — M25531 Pain in right wrist: Secondary | ICD-10-CM | POA: Diagnosis not present

## 2022-04-06 DIAGNOSIS — S63619A Unspecified sprain of unspecified finger, initial encounter: Secondary | ICD-10-CM | POA: Diagnosis not present

## 2022-04-06 DIAGNOSIS — M79641 Pain in right hand: Secondary | ICD-10-CM | POA: Diagnosis not present

## 2022-07-17 DIAGNOSIS — E890 Postprocedural hypothyroidism: Secondary | ICD-10-CM | POA: Diagnosis not present

## 2022-07-17 DIAGNOSIS — E119 Type 2 diabetes mellitus without complications: Secondary | ICD-10-CM | POA: Diagnosis not present

## 2022-07-17 DIAGNOSIS — Z85850 Personal history of malignant neoplasm of thyroid: Secondary | ICD-10-CM | POA: Diagnosis not present
